# Patient Record
Sex: Male | Born: 1980 | Race: White | Hispanic: No | Marital: Married | Smoking: Never smoker
Health system: Southern US, Community
[De-identification: ages and names within clinical notes are randomized; demographics above are authoritative.]

## PROBLEM LIST (undated history)

## (undated) DIAGNOSIS — G8929 Other chronic pain: Secondary | ICD-10-CM

## (undated) DIAGNOSIS — E785 Hyperlipidemia, unspecified: Secondary | ICD-10-CM

## (undated) DIAGNOSIS — E559 Vitamin D deficiency, unspecified: Secondary | ICD-10-CM

## (undated) DIAGNOSIS — M1990 Unspecified osteoarthritis, unspecified site: Secondary | ICD-10-CM

## (undated) DIAGNOSIS — M25569 Pain in unspecified knee: Secondary | ICD-10-CM

## (undated) HISTORY — DX: Unspecified osteoarthritis, unspecified site: M19.90

## (undated) HISTORY — DX: Vitamin D deficiency, unspecified: E55.9

## (undated) HISTORY — PX: KNEE ARTHROSCOPY: SHX127

## (undated) HISTORY — DX: Hyperlipidemia, unspecified: E78.5

---

## 2013-09-10 DIAGNOSIS — E785 Hyperlipidemia, unspecified: Secondary | ICD-10-CM | POA: Insufficient documentation

## 2013-09-10 DIAGNOSIS — M1990 Unspecified osteoarthritis, unspecified site: Secondary | ICD-10-CM | POA: Insufficient documentation

## 2013-09-17 ENCOUNTER — Encounter: Payer: Self-pay | Admitting: Internal Medicine

## 2013-09-17 ENCOUNTER — Ambulatory Visit (INDEPENDENT_AMBULATORY_CARE_PROVIDER_SITE_OTHER): Payer: BC Managed Care – PPO | Admitting: Internal Medicine

## 2013-09-17 VITALS — BP 126/82 | HR 72 | Temp 98.1°F | Resp 16 | Ht 71.0 in | Wt 222.8 lb

## 2013-09-17 DIAGNOSIS — Z111 Encounter for screening for respiratory tuberculosis: Secondary | ICD-10-CM

## 2013-09-17 DIAGNOSIS — Z79899 Other long term (current) drug therapy: Secondary | ICD-10-CM

## 2013-09-17 DIAGNOSIS — Z0000 Encounter for general adult medical examination without abnormal findings: Secondary | ICD-10-CM

## 2013-09-17 DIAGNOSIS — R740 Nonspecific elevation of levels of transaminase and lactic acid dehydrogenase [LDH]: Secondary | ICD-10-CM

## 2013-09-17 DIAGNOSIS — Z1212 Encounter for screening for malignant neoplasm of rectum: Secondary | ICD-10-CM

## 2013-09-17 DIAGNOSIS — R7402 Elevation of levels of lactic acid dehydrogenase (LDH): Secondary | ICD-10-CM

## 2013-09-17 DIAGNOSIS — E559 Vitamin D deficiency, unspecified: Secondary | ICD-10-CM

## 2013-09-17 DIAGNOSIS — Z113 Encounter for screening for infections with a predominantly sexual mode of transmission: Secondary | ICD-10-CM

## 2013-09-17 LAB — CBC WITH DIFFERENTIAL/PLATELET
BASOS PCT: 0 % (ref 0–1)
Basophils Absolute: 0 10*3/uL (ref 0.0–0.1)
EOS ABS: 0 10*3/uL (ref 0.0–0.7)
EOS PCT: 1 % (ref 0–5)
HCT: 42.3 % (ref 39.0–52.0)
Hemoglobin: 14.8 g/dL (ref 13.0–17.0)
Lymphocytes Relative: 26 % (ref 12–46)
Lymphs Abs: 1.2 10*3/uL (ref 0.7–4.0)
MCH: 30.7 pg (ref 26.0–34.0)
MCHC: 35 g/dL (ref 30.0–36.0)
MCV: 87.8 fL (ref 78.0–100.0)
Monocytes Absolute: 0.5 10*3/uL (ref 0.1–1.0)
Monocytes Relative: 10 % (ref 3–12)
NEUTROS PCT: 63 % (ref 43–77)
Neutro Abs: 2.8 10*3/uL (ref 1.7–7.7)
Platelets: 173 10*3/uL (ref 150–400)
RBC: 4.82 MIL/uL (ref 4.22–5.81)
RDW: 13.1 % (ref 11.5–15.5)
WBC: 4.5 10*3/uL (ref 4.0–10.5)

## 2013-09-17 LAB — HEMOGLOBIN A1C
HEMOGLOBIN A1C: 5.4 % (ref <5.7)
Mean Plasma Glucose: 108 mg/dL (ref <117)

## 2013-09-17 NOTE — Progress Notes (Signed)
Patient ID: Richard Snow,Richard Snow, male   DOB: 1981/03/27, 33 y.o.   MRN: [id number]   Annual Screening Comprehensive Examination   This very nice 33 y.o.Sep WM presents for complete physical.  Patient has no major health issues. He is known to have Hyperlipidemia attempting dietary control. 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 He has had Arthroscopy x1 on the Lt (2001) and on the Rt x2 - 2005; 2009)    Finally, patient has history of Vitamin D Deficiency with last vitamin D 72 in Mar 2014.  Medication Sig  . Cholecalciferol (VITAMIN D PO) Take 10,000 Int'l Units by mouth daily.    No Known Allergies  Past Medical History  Diagnosis Date  . Vitamin D deficiency   . Hyperlipidemia   . DJD (degenerative joint disease)     knees    Past Surgical History  Procedure Laterality Date  . Knee arthroscopy Bilateral 2005 right 2007 left    History   Social History  . Marital Status: Legally Separated    Spouse Name: N/A    Number of Children: N/A  . Years of Education: N/A   Occupational History  . Not on file.   Social History Main Topics  . Smoking status: Never Smoker   . Smokeless tobacco: Not on file  . Alcohol Use: Yes     Comment: ocassional  . Drug Use: No  . Sexual Activity: Not on file                                                                                                                                                                                                         Social History Narrative  . 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    ROS Constitutional: Denies fever, chills, weight loss/gain, headaches, insomnia, fatigue, night sweats, and change in appetite. Eyes: Denies redness, blurred vision, diplopia, discharge, itchy, watery eyes.  ENT: Denies discharge, congestion, post nasal drip, epistaxis, sore throat, earache, hearing loss,  dental pain, Tinnitus, Vertigo, Sinus pain, snoring.  Cardio: Denies chest pain, palpitations, irregular heartbeat, syncope, dyspnea, diaphoresis, orthopnea, PND, claudication, edema Respiratory: denies cough, dyspnea, DOE, pleurisy, hoarseness, laryngitis, wheezing.  Gastrointestinal: Denies dysphagia, heartburn, reflux, water brash, pain, cramps, nausea, vomiting, bloating, diarrhea, constipation, hematemesis, melena, hematochezia, jaundice, hemorrhoids Genitourinary: Denies dysuria, frequency, urgency, nocturia, hesitancy, discharge, hematuria, flank pain Breast: Breast lumps, nipple discharge, bleeding.  Musculoskeletal: Denies arthralgia, myalgia, stiffness, Jt. Swelling,  pain, limp, and strain/sprain. Skin: Denies puritis, rash, hives, warts, acne, eczema, changing in skin lesion Neuro: Weakness, tremor, incoordination, spasms, paresthesia, pain Psychiatric: Denies confusion, memory loss, sensory loss. Endocrine: Denies change in weight, skin, hair change, nocturia, and paresthesia, diabetic polys, visual blurring, hyper /hypo glycemic episodes.  Heme/Lymph: No excessive bleeding, bruising, enlarged lymph nodes.   Physical Exam    BP 126/82  Pulse 72  Temp 98.1 F   Resp 16  Ht 5\' 11"    Wt 222 lb 12.8 oz   BMI 31.09 kg/m2  General Appearance: Well nourished, in no apparent distress. Eyes: PERRLA, EOMs, conjunctiva no swelling or erythema, normal fundi and vessels. Sinuses: No frontal/maxillary tenderness ENT/Mouth: EACs patent / TMs  nl. Nares clear without erythema, swelling, mucoid exudates. Oral hygiene is good. No erythema, swelling, or exudate. Tongue normal, non-obstructing. Tonsils not swollen or erythematous. Hearing normal.  Neck: Supple, thyroid normal. No bruits, nodes or JVD. Respiratory: Respiratory effort normal.  BS equal and clear bilateral without rales, rhonci, wheezing or stridor. Cardio: Heart sounds are normal with regular rate and rhythm and no murmurs, rubs  or gallops. Peripheral pulses are normal and equal bilaterally without edema. No aortic or femoral bruits. Chest: symmetric with normal excursions and percussion. Breasts: Symmetric, without lumps, nipple discharge, retractions, or fibrocystic changes.  Abdomen: Flat, soft, with bowl sounds. Nontender, no guarding, rebound, hernias, masses, or organomegaly.  Lymphatics: Non tender without lymphadenopathy.  Genitourinary: No hernias. Testes nl/unremarkable. Musculoskeletal: Full ROM all peripheral extremities, joint stability, 5/5 strength, and normal gait. Skin: Warm and dry without rashes, lesions, cyanosis, clubbing or  ecchymosis.  Neuro: Cranial nerves intact, reflexes equal bilaterally. Normal muscle tone, no cerebellar symptoms. Sensation intact.  Pysch: Awake and oriented X 3, normal affect, Insight and Judgment appropriate.   Assessment and Plan  1. Annual Screening Examination 2. DJD, Knees 3. Hyperlipidemia 4. Vitamin D Deficiency  Continue prudent diet as discussed, weight control, regular exercise, and medications. Routine screening labs and tests as requested with regular follow-up as recommended.

## 2013-09-17 NOTE — Patient Instructions (Addendum)
Cholesterol Cholesterol is a white, waxy, fat-like protein needed by your body in small amounts. The liver makes all the cholesterol you need. It is carried from the liver by the blood through the blood vessels. Deposits (plaque) may build up on blood vessel walls. This makes the arteries narrower and stiffer. Plaque increases the risk for heart attack and stroke. You cannot feel your cholesterol level even if it is very high. The only way to know is by a blood test to check your lipid (fats) levels. Once you know your cholesterol levels, you should keep a record of the test results. Work with your caregiver to to keep your levels in the desired range. WHAT THE RESULTS MEAN:  Total cholesterol is a rough measure of all the cholesterol in your blood.  LDL is the so-called bad cholesterol. This is the type that deposits cholesterol in the walls of the arteries. You want this level to be low.  HDL is the good cholesterol because it cleans the arteries and carries the LDL away. You want this level to be high.  Triglycerides are fat that the body can either burn for energy or store. High levels are closely linked to heart disease. DESIRED LEVELS:  Total cholesterol below 200.  LDL below 100 for people at risk, below 70 for very high risk.  HDL above 50 is good, above 60 is best.  Triglycerides below 150. HOW TO LOWER YOUR CHOLESTEROL:  Diet.  Choose fish or white meat chicken and [place], roasted or baked. Limit fatty cuts of red meat, fried foods, and processed meats, such as sausage and lunch meat.  Eat lots of fresh fruits and vegetables. Choose whole grains, beans, pasta, potatoes and cereals.  Use only small amounts of olive, corn or canola oils. Avoid butter, mayonnaise, shortening or palm kernel oils. Avoid foods with trans-fats.  Use skim/nonfat milk and low-fat/nonfat yogurt and cheeses. Avoid whole milk, cream, ice cream, egg yolks and cheeses. Healthy desserts include angel food  cake, ginger snaps, animal crackers, hard candy, popsicles, and low-fat/nonfat frozen yogurt. Avoid pastries, cakes, pies and cookies.  Exercise.  A regular program helps decrease LDL and raises HDL.  Helps with weight control.  Do things that increase your activity level like gardening, walking, or taking the stairs.  Medication.  May be prescribed by your caregiver to help lowering cholesterol and the risk for heart disease.  You may need medicine even if your levels are normal if you have several risk factors. HOME CARE INSTRUCTIONS   Follow your diet and exercise programs as suggested by your caregiver.  Take medications as directed.  Have blood work done when your caregiver feels it is necessary. MAKE SURE YOU:   Understand these instructions.  Will watch your condition.  Will get help right away if you are not doing well or get worse.      Vitamin D Deficiency Vitamin D is an important vitamin that your body needs. Having too little of it in your body is called a deficiency. A very bad deficiency can make your bones soft and can cause a condition called rickets.  Vitamin D is important to your body for different reasons, such as:   It helps your body absorb 2 minerals called calcium and phosphorus.  It helps make your bones healthy.  It may prevent some diseases, such as diabetes and multiple sclerosis.  It helps your muscles and heart. You can get vitamin D in several ways. It is a natural part  of some foods. The vitamin is also added to some dairy products and cereals. Some people take vitamin D supplements. Also, your body makes vitamin D when you are in the sun. It changes the sun's rays into a form of the vitamin that your body can use. CAUSES   Not eating enough foods that contain vitamin D.  Not getting enough sunlight.  Having certain digestive system diseases that make it hard to absorb vitamin D. These diseases include Crohn's disease, chronic  pancreatitis, and cystic fibrosis.  Having a surgery in which part of the stomach or small intestine is removed.  Being obese. Fat cells pull vitamin D out of your blood. That means that obese people may not have enough vitamin D left in their blood and in other body tissues.  Having chronic kidney or liver disease. RISK FACTORS Risk factors are things that make you more likely to develop a vitamin D deficiency. They include:  Being older.  Not being able to get outside very much.  Living in a nursing home.  Having had broken bones.  Having weak or thin bones (osteoporosis).  Having a disease or condition that changes how your body absorbs vitamin D.  Having dark skin.  Some medicines such as seizure medicines or steroids.  Being overweight or obese. SYMPTOMS Mild cases of vitamin D deficiency may not have any symptoms. If you have a very bad case, symptoms may include:  Bone pain.  Muscle pain.  Falling often.  Broken bones caused by a minor injury, due to osteoporosis. DIAGNOSIS A blood test is the best way to tell if you have a vitamin D deficiency. TREATMENT Vitamin D deficiency can be treated in different ways. Treatment for vitamin D deficiency depends on what is causing it. Options include:  Taking vitamin D supplements.  Taking a calcium supplement. Your caregiver will suggest what dose is best for you. HOME CARE INSTRUCTIONS  Take any supplements that your caregiver prescribes. Follow the directions carefully. Take only the suggested amount.  Have your blood tested 2 months after you start taking supplements.  Eat foods that contain vitamin D. Healthy choices include:  Fortified dairy products, cereals, or juices. Fortified means vitamin D has been added to the food. Check the label on the package to be sure.  Fatty fish like salmon or trout.  Eggs.  Oysters.  Do not use a tanning bed.  Keep your weight at a healthy level. Lose weight if you  need to.  Keep all follow-up appointments. Your caregiver will need to perform blood tests to make sure your vitamin D deficiency is going away. SEEK MEDICAL CARE IF:  You have any questions about your treatment.  You continue to have symptoms of vitamin D deficiency.  You have nausea or vomiting.  You are constipated.  You feel confused.  You have severe abdominal or back pain. MAKE SURE YOU:  Understand these instructions.  Will watch your condition.  Will get help right away if you are not doing well or get worse.   Wear and Tear Disorders of the Knee (Arthritis, Osteoarthritis) Everyone will experience wear and tear injuries (arthritis, osteoarthritis) of the knee. These are the changes we all get as we age. They come from the joint stress of daily living. The amount of cartilage damage in your knee and your symptoms determine if you need surgery. Mild problems require approximately two months recovery time. More severe problems take several months to recover. With mild problems, your surgeon  may find worn and rough cartilage surfaces. With severe changes, your surgeon may find cartilage that has completely worn away and exposed the bone. Loose bodies of bone and cartilage, bone spurs (excess bone growth), and injuries to the menisci (cushions between the large bones of your leg) are also common. All of these problems can cause pain. For a mild wear and tear problem, rough cartilage may simply need to be shaved and smoothed. For more severe problems with areas of exposed bone, your surgeon may use an instrument for roughing up the bone surfaces to stimulate new cartilage growth. Loose bodies are usually removed. Torn menisci may be trimmed or repaired. ABOUT THE ARTHROSCOPIC PROCEDURE Arthroscopy is a surgical technique. It allows your orthopedic surgeon to diagnose and treat your knee injury with accuracy. The surgeon looks into your knee through a small scope. The scope is like a  small (pencil-sized) telescope. Arthroscopy is less invasive than open knee surgery. You can expect a more rapid recovery. After the procedure, you will be moved to a recovery area until most of the effects of the medication have worn off. Your caregiver will discuss the test results with you. RECOVERY The severity of the arthritis and the type of procedure performed will determine recovery time. Other important factors include age, physical condition, medical conditions, and the type of rehabilitation program. Strengthening your muscles after arthroscopy helps guarantee a better recovery. Follow your caregiver's instructions. Use crutches, rest, elevate, ice, and do knee exercises as instructed. Your caregivers will help you and instruct you with exercises and other physical therapy required to regain your mobility, muscle strength, and functioning following surgery. Only take over-the-counter or prescription medicines for pain, discomfort, or fever as directed by your caregiver.  SEEK MEDICAL CARE IF:  There is increased bleeding (more than a small spot) from the wound. You notice redness, swelling, or increasing pain in the wound. Pus is coming from wound. You develop an unexplained oral temperature above 102 F (38.9 C) , or as your caregiver suggests. You notice a foul smell coming from the wound or dressing. You have severe pain with motion of the knee. SEEK IMMEDIATE MEDICAL CARE IF:  You develop a rash. You have difficulty breathing. You have any allergic problems. MAKE SURE YOU:  Understand these instructions. Will watch your condition. Will get help right away if you are not doing well or get worse. Document Released: 06/07/2000 Document Revised: 09/02/2011 Document Reviewed: 11/04/2007 [facility] Patient Information 2014 [place] [place].

## 2013-09-18 LAB — LIPID PANEL
Cholesterol: 178 mg/dL (ref 0–200)
HDL: 52 mg/dL (ref >39)
LDL Cholesterol: 114 mg/dL — ABNORMAL HIGH (ref 0–99)
Total CHOL/HDL Ratio: 3.4 Ratio
Triglycerides: 59 mg/dL (ref <150)
VLDL: 12 mg/dL (ref 0–40)

## 2013-09-18 LAB — BASIC METABOLIC PANEL WITH GFR
BUN: 17 mg/dL (ref 6–23)
CO2: 20 mEq/L (ref 19–32)
CREATININE: 1.11 mg/dL (ref 0.50–1.35)
Calcium: 9.3 mg/dL (ref 8.4–10.5)
Chloride: 106 mEq/L (ref 96–112)
GFR, Est Non African American: 87 mL/min
Glucose, Bld: 92 mg/dL (ref 70–99)
Potassium: 4.6 mEq/L (ref 3.5–5.3)
SODIUM: 141 meq/L (ref 135–145)

## 2013-09-18 LAB — TSH: TSH: 1.283 u[IU]/mL (ref 0.350–4.500)

## 2013-09-18 LAB — MICROALBUMIN / CREATININE URINE RATIO
Creatinine, Urine: 130.8 mg/dL
Microalb Creat Ratio: 3.8 mg/g (ref 0.0–30.0)
Microalb, Ur: 0.5 mg/dL (ref 0.00–1.89)

## 2013-09-18 LAB — TESTOSTERONE: Testosterone: 453 ng/dL (ref 300–890)

## 2013-09-18 LAB — RPR

## 2013-09-18 LAB — HEPATIC FUNCTION PANEL
ALBUMIN: 4.7 g/dL (ref 3.5–5.2)
ALT: 19 U/L (ref 0–53)
AST: 19 U/L (ref 0–37)
Alkaline Phosphatase: 44 U/L (ref 39–117)
BILIRUBIN DIRECT: 0.1 mg/dL (ref 0.0–0.3)
BILIRUBIN TOTAL: 0.4 mg/dL (ref 0.2–1.2)
Indirect Bilirubin: 0.3 mg/dL (ref 0.2–1.2)
Total Protein: 6.8 g/dL (ref 6.0–8.3)

## 2013-09-18 LAB — MAGNESIUM: Magnesium: 2.1 mg/dL (ref 1.5–2.5)

## 2013-09-18 LAB — URINALYSIS, MICROSCOPIC ONLY
BACTERIA UA: NONE SEEN
CASTS: NONE SEEN
Crystals: NONE SEEN
Squamous Epithelial / LPF: NONE SEEN

## 2013-09-18 LAB — HIV ANTIBODY (ROUTINE TESTING W REFLEX): HIV: NONREACTIVE

## 2013-09-18 LAB — HEPATITIS A ANTIBODY, TOTAL: Hep A Total Ab: REACTIVE — AB

## 2013-09-18 LAB — HEPATITIS C ANTIBODY: HCV AB: NEGATIVE

## 2013-09-18 LAB — VITAMIN D 25 HYDROXY (VIT D DEFICIENCY, FRACTURES): VIT D 25 HYDROXY: 63 ng/mL (ref 30–89)

## 2013-09-18 LAB — HEPATITIS B CORE ANTIBODY, TOTAL: HEP B C TOTAL AB: NONREACTIVE

## 2013-09-18 LAB — INSULIN, FASTING: Insulin fasting, serum: 3 u[IU]/mL (ref 3–28)

## 2013-09-18 LAB — VITAMIN B12: VITAMIN B 12: 481 pg/mL (ref 211–911)

## 2013-09-18 LAB — HEPATITIS B SURFACE ANTIBODY,QUALITATIVE: HEP B S AB: POSITIVE — AB

## 2013-09-20 LAB — HEPATITIS B E ANTIBODY: Hepatitis Be Antibody: NONREACTIVE

## 2013-09-22 LAB — TB SKIN TEST
Induration: 0 mm
TB Skin Test: NEGATIVE

## 2014-09-23 ENCOUNTER — Encounter: Payer: Self-pay | Admitting: Internal Medicine

## 2014-10-07 ENCOUNTER — Encounter: Payer: Self-pay | Admitting: Internal Medicine

## 2014-11-01 ENCOUNTER — Ambulatory Visit (INDEPENDENT_AMBULATORY_CARE_PROVIDER_SITE_OTHER): Payer: BLUE CROSS/BLUE SHIELD | Admitting: Internal Medicine

## 2014-11-01 ENCOUNTER — Encounter: Payer: Self-pay | Admitting: Internal Medicine

## 2014-11-01 VITALS — BP 118/76 | HR 72 | Temp 97.5°F | Resp 16 | Ht 71.0 in | Wt 235.6 lb

## 2014-11-01 DIAGNOSIS — E785 Hyperlipidemia, unspecified: Secondary | ICD-10-CM

## 2014-11-01 DIAGNOSIS — R5383 Other fatigue: Secondary | ICD-10-CM | POA: Diagnosis not present

## 2014-11-01 DIAGNOSIS — Z0001 Encounter for general adult medical examination with abnormal findings: Secondary | ICD-10-CM

## 2014-11-01 DIAGNOSIS — M2390 Unspecified internal derangement of unspecified knee: Secondary | ICD-10-CM

## 2014-11-01 DIAGNOSIS — Z79899 Other long term (current) drug therapy: Secondary | ICD-10-CM | POA: Diagnosis not present

## 2014-11-01 DIAGNOSIS — R6889 Other general symptoms and signs: Secondary | ICD-10-CM

## 2014-11-01 DIAGNOSIS — Z1212 Encounter for screening for malignant neoplasm of rectum: Secondary | ICD-10-CM

## 2014-11-01 DIAGNOSIS — Z111 Encounter for screening for respiratory tuberculosis: Secondary | ICD-10-CM | POA: Diagnosis not present

## 2014-11-01 DIAGNOSIS — E559 Vitamin D deficiency, unspecified: Secondary | ICD-10-CM

## 2014-11-01 DIAGNOSIS — R7309 Other abnormal glucose: Secondary | ICD-10-CM | POA: Insufficient documentation

## 2014-11-01 DIAGNOSIS — R030 Elevated blood-pressure reading, without diagnosis of hypertension: Secondary | ICD-10-CM | POA: Diagnosis not present

## 2014-11-01 DIAGNOSIS — IMO0001 Reserved for inherently not codable concepts without codable children: Secondary | ICD-10-CM | POA: Insufficient documentation

## 2014-11-01 LAB — CBC WITH DIFFERENTIAL/PLATELET
BASOS PCT: 0 % (ref 0–1)
Basophils Absolute: 0 10*3/uL (ref 0.0–0.1)
Eosinophils Absolute: 0.1 10*3/uL (ref 0.0–0.7)
Eosinophils Relative: 1 % (ref 0–5)
HEMATOCRIT: 40.2 % (ref 39.0–52.0)
HEMOGLOBIN: 14.1 g/dL (ref 13.0–17.0)
LYMPHS ABS: 2.2 10*3/uL (ref 0.7–4.0)
Lymphocytes Relative: 38 % (ref 12–46)
MCH: 30.3 pg (ref 26.0–34.0)
MCHC: 35.1 g/dL (ref 30.0–36.0)
MCV: 86.3 fL (ref 78.0–100.0)
MONOS PCT: 7 % (ref 3–12)
MPV: 9.3 fL (ref 8.6–12.4)
Monocytes Absolute: 0.4 10*3/uL (ref 0.1–1.0)
NEUTROS PCT: 54 % (ref 43–77)
Neutro Abs: 3.1 10*3/uL (ref 1.7–7.7)
Platelets: 153 10*3/uL (ref 150–400)
RBC: 4.66 MIL/uL (ref 4.22–5.81)
RDW: 14.1 % (ref 11.5–15.5)
WBC: 5.8 10*3/uL (ref 4.0–10.5)

## 2014-11-01 LAB — HEMOGLOBIN A1C
Hgb A1c MFr Bld: 5.5 % (ref <5.7)
MEAN PLASMA GLUCOSE: 111 mg/dL (ref <117)

## 2014-11-01 NOTE — Progress Notes (Signed)
Patient ID: Richard Finlayharles Rietz Jr., male   DOB: 11-22-1980, 34 y.o.   MRN: [id number]   Annual Comprehensive Examination  This very nice 34 y.o. MWM presents for complete physical.  Patient is screened for labile  HTN, Prediabetes, Hyperlipidemia, and Vitamin D Deficiency. 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 He has had Arthroscopy x1 on the Lt (2001) and on the Rt x2 - 2005; 2009)     Patient denies any cardiac symptoms as chest pain, palpitations, shortness of breath, dizziness or ankle swelling. Today's BP is 118/76 mmHg.   Patient's hyperlipidemia is not controlled with diet. Last lipids were  Not at goal as below.   Lab Results  Component Value Date   CHOL 178 09/17/2013   HDL 52 09/17/2013   LDLCALC 114* 09/17/2013   TRIG 59 09/17/2013   CHOLHDL 3.4 09/17/2013    Patient has morbid Obesity (BMI 32+) and consequently is screened for prediabetes  and patient denies reactive hypoglycemic symptoms, visual blurring, diabetic polys or paresthesias. Last A1c was 5.4% in Mar 2015.     Finally, patient has history of Vitamin D Deficiency of 24 in 2022 and last vitamin D was 63 in Mar 2015.      Medication Sig  . Cholecalciferol (VITAMIN D PO) Take 10,000 Int'l Units by mouth daily.  Marland Kitchen. oxyCODONE-acetaminophen (PERCOCET) 5-325 MG per tablet Take by mouth every 4 (four) hours as needed for severe pain. Patient states [facility]  . meloxicam (MOBIC) 15 MG tablet Take 15 mg by mouth daily.   No Known Allergies   Past Medical History  Diagnosis Date  . Vitamin D deficiency   . Hyperlipidemia   . DJD (degenerative joint disease)     knees   Health Maintenance  Topic Date Due  . INFLUENZA VACCINE  01/23/2015  . TETANUS/TDAP  09/02/2020  . HIV Screening  Completed   Immunization History  Administered Date(s) Administered  . PPD Test 09/17/2013  .  Pneumococcal Polysaccharide-23 08/28/2010  . Tdap 09/03/2010   Past Surgical History  Procedure Laterality Date  . Knee arthroscopy Bilateral 2005 right 2007 left   History   Social History  . Marital Status: Legally Separated    Spouse Name: N/A  . Number of Children: N/A  . Years of Education: N/A   Occupational History  . Not on file.   Social History Main Topics  . Smoking status: Never Smoker   . Smokeless tobacco: Not on file  . Alcohol Use: Yes     Comment: ocassional  . Drug Use: No  . Sexual Activity: Not on file    ROS Constitutional: Denies fever, chills, weight loss/gain, headaches, insomnia,  night sweats or change in appetite. Does c/o fatigue. Eyes: Denies redness, blurred vision, diplopia, discharge, itchy or watery eyes.  ENT: Denies discharge, congestion, post nasal drip, epistaxis, sore throat, earache, hearing loss, dental pain, Tinnitus, Vertigo, Sinus pain or snoring.  Cardio: Denies chest pain, palpitations, irregular heartbeat, syncope, dyspnea, diaphoresis, orthopnea, PND, claudication or edema Respiratory: denies cough, dyspnea, DOE, pleurisy, hoarseness, laryngitis or wheezing.  Gastrointestinal: Denies dysphagia, heartburn, reflux, water brash, pain, cramps, nausea, vomiting, bloating, diarrhea, constipation, hematemesis, melena, hematochezia, jaundice or hemorrhoids Genitourinary: Denies dysuria, frequency, urgency, nocturia, hesitancy, discharge, hematuria or flank pain Musculoskeletal: Denies arthralgia, myalgia, stiffness, Jt. Swelling, pain, limp or  strain/sprain. Denies Falls. Skin: Denies puritis, rash, hives, warts, acne, eczema or change in skin lesion Neuro: No weakness, tremor, incoordination, spasms, paresthesia or pain Psychiatric: Denies confusion, memory loss or sensory loss. Denies Depression. Endocrine: Denies change in weight, skin, hair change, nocturia, and paresthesia, diabetic polys, visual blurring or hyper / hypo glycemic  episodes.  Heme/Lymph: No excessive bleeding, bruising or enlarged lymph nodes.  Physical Exam  BP 118/76   Pulse 72  Temp 97.5 F   Resp 16  Ht 5\' 11"    Wt 235 lb 9.6 oz    BMI 32.87   General Appearance: Well nourished, in no apparent distress. Eyes: PERRLA, EOMs, conjunctiva no swelling or erythema, normal fundi and vessels. Sinuses: No frontal/maxillary tenderness ENT/Mouth: EACs patent / TMs  nl. Nares clear without erythema, swelling, mucoid exudates. Oral hygiene is good. No erythema, swelling, or exudate. Tongue normal, non-obstructing. Tonsils not swollen or erythematous. Hearing normal.  Neck: Supple, thyroid normal. No bruits, nodes or JVD. Respiratory: Respiratory effort normal.  BS equal and clear bilateral without rales, rhonci, wheezing or stridor. Cardio: Heart sounds are normal with regular rate and rhythm and no murmurs, rubs or gallops. Peripheral pulses are normal and equal bilaterally without edema. No aortic or femoral bruits. Chest: symmetric with normal excursions and percussion.  Abdomen: Flat, soft, with bowel sounds. Nontender, no guarding, rebound, hernias, masses, or organomegaly.  Lymphatics: Non tender without lymphadenopathy.  Genitourinary: No hernias.Testes nl. Musculoskeletal: Full ROM all peripheral extremities, joint stability, 5/5 strength, and normal gait. Sl crepitus of the knees w/o synovitis or effusion.  Skin: Warm and dry without rashes, lesions, cyanosis, clubbing or  ecchymosis.  Neuro: Cranial nerves intact, reflexes equal bilaterally. Normal muscle tone, no cerebellar symptoms. Sensation intact.  Pysch: Awake and oriented X 3 with normal affect, insight and judgment appropriate.   Assessment and Plan  1. Elevated BP screening   2. Hyperlipidemia  - Lipid panel  3. Abnormal glucose screening  - Hemoglobin A1c - Insulin, random  4. Vitamin D deficiency  - Vit D  25 hydroxy   5. Screening for rectal cancer   6. Other  fatigue  - Vitamin B12 - Iron and TIBC - TSH  7. Medication management  - Urine Microscopic - CBC with Differential/Platelet - BASIC METABOLIC PANEL WITH GFR - Hepatic function panel - Magnesium   Continue prudent diet as discussed, weight control, BP monitoring, regular exercise, and medications as discussed.  Discussed med effects and SE's. Routine screening labs and tests as requested with regular follow-up as recommended. Over 40 minutes of exam, counseling &  chart review was performed

## 2014-11-01 NOTE — Patient Instructions (Signed)
Recommend Low dose or baby Aspirin 81 mg daily   To reduce risk of Colon Cancer 20 %, Skin Cancer 26 % , Melanoma 46% and   Pancreatic cancer 60%  ++++++++++++++++++ Vitamin D goal is between 70-100.   Please make sure that you are taking your Vitamin D as directed.   It is very important as a natural antiinflammatory   helping hair, skin, and nails, as well as reducing stroke and heart attack risk.   It helps your bones and helps with mood.  It also decreases numerous cancer risks so please take it as directed.   Low Vit D is associated with a 200-300% higher risk for CANCER   and 200-300% higher risk for HEART   ATTACK  &  STROKE.    .....................................Marland Kitchen  It is also associated with higher death rate at younger ages,   autoimmune diseases like Rheumatoid arthritis, Lupus, Multiple Sclerosis.     Also many other serious conditions, like depression, Alzheimer's  Dementia, infertility, muscle aches, fatigue, fibromyalgia - just to name a few.  +++++++++++++++++++  Preventive Care for Adults  A healthy lifestyle and preventive care can promote health and wellness. Preventive health guidelines for men include the following key practices:  A routine yearly physical is a good way to check with your health care provider about your health and preventative screening. It is a chance to share any concerns and updates on your health and to receive a thorough exam.  Visit your dentist for a routine exam and preventative care every 6 months. Brush your teeth twice a day and floss once a day. Good oral hygiene prevents tooth decay and gum disease.  The frequency of eye exams is based on your age, health, family medical history, use of contact lenses, and other factors. Follow your health care provider's recommendations for frequency of eye exams.  Eat a healthy diet. Foods such as vegetables, fruits, whole grains, low-fat dairy products, and lean protein foods contain  the nutrients you need without too many calories. Decrease your intake of foods high in solid fats, added sugars, and salt. Eat the right amount of calories for you.Get information about a proper diet from your health care provider, if necessary.  Regular physical exercise is one of the most important things you can do for your health. Most adults should get at least 150 minutes of moderate-intensity exercise (any activity that increases your heart rate and causes you to sweat) each week. In addition, most adults need muscle-strengthening exercises on 2 or more days a week.  Maintain a healthy weight. The body mass index (BMI) is a screening tool to identify possible weight problems. It provides an estimate of body fat based on height and weight. Your health care provider can find your BMI and can help you achieve or maintain a healthy weight.For adults 20 years and older:  A BMI below 18.5 is considered underweight.  A BMI of 18.5 to 24.9 is normal.  A BMI of 25 to 29.9 is considered overweight.  A BMI of 30 and above is considered obese.  Maintain normal blood lipids and cholesterol levels by exercising and minimizing your intake of saturated fat. Eat a balanced diet with plenty of fruit and vegetables. Blood tests for lipids and cholesterol should begin at age 68 and be repeated every 5 years. If your lipid or cholesterol levels are high, you are over 50, or you are at high risk for heart disease, you may need your cholesterol levels  checked more frequently.Ongoing high lipid and cholesterol levels should be treated with medicines if diet and exercise are not working.  If you smoke, find out from your health care provider how to quit. If you do not use tobacco, do not start.  Lung cancer screening is recommended for adults aged 68-80 years who are at high risk for developing lung cancer because of a history of smoking. A yearly low-dose CT scan of the lungs is recommended for people who have  at least a 30-pack-year history of smoking and are a current smoker or have quit within the past 15 years. A pack year of smoking is smoking an average of 1 pack of cigarettes a day for 1 year (for example: 1 pack a day for 30 years or 2 packs a day for 15 years). Yearly screening should continue until the smoker has stopped smoking for at least 15 years. Yearly screening should be stopped for people who develop a health problem that would prevent them from having lung cancer treatment.  If you choose to drink alcohol, do not have more than 2 drinks per day. One drink is considered to be 12 ounces (355 mL) of beer, 5 ounces (148 mL) of wine, or 1.5 ounces (44 mL) of liquor.  High blood pressure causes heart disease and increases the risk of stroke. Your blood pressure should be checked. Ongoing high blood pressure should be treated with medicines, if weight loss and exercise are not effective.  If you are 49-19 years old, ask your health care provider if you should take aspirin to prevent heart disease.  Diabetes screening involves taking a blood sample to check your fasting blood sugar level. Testing should be considered at a younger age or be carried out more frequently if you are overweight and have at least 1 risk factor for diabetes.  Colorectal cancer can be detected and often prevented. Most routine colorectal cancer screening begins at the age of 37 and continues through age 3. However, your health care provider may recommend screening at an earlier age if you have risk factors for colon cancer. On a yearly basis, your health care provider may provide home test kits to check for hidden blood in the stool. Use of a small camera at the end of a tube to directly examine the colon (sigmoidoscopy or colonoscopy) can detect the earliest forms of colorectal cancer. Talk to your health care provider about this at age 84, when routine screening begins. Direct exam of the colon should be repeated every 5-10  years through age 70, unless early forms of precancerous polyps or small growths are found.  Screening for abdominal aortic aneurysm (AAA)  are recommended for persons over age 25 who have history of hypertensionor who are current or former smokers.  Talk with your health care provider about prostate cancer screening.  Testicular cancer screening is recommended for adult males. Screening includes self-exam, a health care provider exam, and other screening tests. Consult with your health care provider about any symptoms you have or any concerns you have about testicular cancer.  Use sunscreen. Apply sunscreen liberally and repeatedly throughout the day. You should seek shade when your shadow is shorter than you. Protect yourself by wearing long sleeves, pants, a wide-brimmed hat, and sunglasses year round, whenever you are outdoors.  Once a month, do a whole-body skin exam, using a mirror to look at the skin on your back. Tell your health care provider about new moles, moles that have irregular borders,  moles that are larger than a pencil eraser, or moles that have changed in shape or color.  Stay current with required vaccines (immunizations).  Influenza vaccine. All adults should be immunized every year.  Tetanus, diphtheria, and acellular pertussis (Td, Tdap) vaccine. An adult who has not previously received Tdap or who does not know his vaccine status should receive 1 dose of Tdap. This initial dose should be followed by tetanus and diphtheria toxoids (Td) booster doses every 10 years. Adults with an unknown or incomplete history of completing a 3-dose immunization series with Td-containing vaccines should begin or complete a primary immunization series including a Tdap dose. Adults should receive a Td booster every 10 years.  Zoster vaccine. One dose is recommended for adults aged 42 years or older unless certain conditions are present.    Pneumococcal 13-valent conjugate (PCV13) vaccine.  When indicated, a person who is uncertain of his immunization history and has no record of immunization should receive the PCV13 vaccine. An adult aged 69 years or older who has certain medical conditions and has not been previously immunized should receive 1 dose of PCV13 vaccine. This PCV13 should be followed with a dose of pneumococcal polysaccharide (PPSV23) vaccine. The PPSV23 vaccine dose should be obtained at least 8 weeks after the dose of PCV13 vaccine. An adult aged 27 years or older who has certain medical conditions and previously received 1 or more doses of PPSV23 vaccine should receive 1 dose of PCV13. The PCV13 vaccine dose should be obtained 1 or more years after the last PPSV23 vaccine dose.    Pneumococcal polysaccharide (PPSV23) vaccine. When PCV13 is also indicated, PCV13 should be obtained first. All adults aged 47 years and older should be immunized. An adult younger than age 62 years who has certain medical conditions should be immunized. Any person who resides in a nursing home or long-term care facility should be immunized. An adult smoker should be immunized. People with an immunocompromised condition and certain other conditions should receive both PCV13 and PPSV23 vaccines. People with human immunodeficiency virus (HIV) infection should be immunized as soon as possible after diagnosis. Immunization during chemotherapy or radiation therapy should be avoided. Routine use of PPSV23 vaccine is not recommended for American Indians, [place] Natives, or people younger than 65 years unless there are medical conditions that require PPSV23 vaccine. When indicated, people who have unknown immunization and have no record of immunization should receive PPSV23 vaccine. One-time revaccination 5 years after the first dose of PPSV23 is recommended for people aged 19-64 years who have chronic kidney failure, nephrotic syndrome, asplenia, or immunocompromised conditions. People who received 1-2 doses of  PPSV23 before age 34 years should receive another dose of PPSV23 vaccine at age 18 years or later if at least 5 years have passed since the previous dose. Doses of PPSV23 are not needed for people immunized with PPSV23 at or after age 29 years.  Hepatitis A vaccine. Adults who wish to be protected from this disease, have certain high-risk conditions, work with hepatitis A-infected animals, work in hepatitis A research labs, or travel to or work in countries with a high rate of hepatitis A should be immunized. Adults who were previously unvaccinated and who anticipate close contact with an international adoptee during the first 60 days after arrival in the [place] from a country with a high rate of hepatitis A should be immunized.  Hepatitis B vaccine. Adults should be immunized if they wish to be protected from this disease, have  certain high-risk conditions, may be exposed to blood or other infectious body fluids, are household contacts or sex partners of hepatitis B positive people, are clients or workers in certain care facilities, or travel to or work in countries with a high rate of hepatitis B.  Preventive Service / Frequency  Ages 43 to 32  Blood pressure check.  Lipid and cholesterol check.  Hepatitis C blood test.** / For any individual with known risks for hepatitis C.  Skin self-exam. / Monthly.  Influenza vaccine. / Every year.  Tetanus, diphtheria, and acellular pertussis (Tdap, Td) vaccine.** / Consult your health care provider. 1 dose of Td every 10 years.  HPV vaccine. / 3 doses over 6 months, if 72 or younger.  Measles, mumps, rubella (MMR) vaccine.** / You need at least 1 dose of MMR if you were born in 1957 or later. You may also need a second dose.  Pneumococcal 13-valent conjugate (PCV13) vaccine.** / Consult your health care provider.  Pneumococcal polysaccharide (PPSV23) vaccine.** / 1 to 2 doses if you smoke cigarettes or if you have certain  conditions.  Meningococcal vaccine.** / 1 dose if you are age 66 to 30 years and a Market researcher living in a residence hall, or have one of several medical conditions. You may also need additional booster doses.  Hepatitis A vaccine.** / Consult your health care provider.  Hepatitis B vaccine.** / Consult your health care provider.

## 2014-11-02 LAB — LIPID PANEL
Cholesterol: 215 mg/dL — ABNORMAL HIGH (ref 0–200)
HDL: 38 mg/dL — AB (ref >=40)
LDL Cholesterol: 125 mg/dL — ABNORMAL HIGH (ref 0–99)
Total CHOL/HDL Ratio: 5.7 Ratio
Triglycerides: 258 mg/dL — ABNORMAL HIGH (ref <150)
VLDL: 52 mg/dL — AB (ref 0–40)

## 2014-11-02 LAB — HEPATIC FUNCTION PANEL
ALBUMIN: 4.4 g/dL (ref 3.5–5.2)
ALK PHOS: 46 U/L (ref 39–117)
ALT: 18 U/L (ref 0–53)
AST: 18 U/L (ref 0–37)
BILIRUBIN INDIRECT: 0.2 mg/dL (ref 0.2–1.2)
Bilirubin, Direct: 0.1 mg/dL (ref 0.0–0.3)
TOTAL PROTEIN: 7.1 g/dL (ref 6.0–8.3)
Total Bilirubin: 0.3 mg/dL (ref 0.2–1.2)

## 2014-11-02 LAB — MAGNESIUM: Magnesium: 2 mg/dL (ref 1.5–2.5)

## 2014-11-02 LAB — BASIC METABOLIC PANEL WITH GFR
BUN: 17 mg/dL (ref 6–23)
CHLORIDE: 100 meq/L (ref 96–112)
CO2: 29 meq/L (ref 19–32)
Calcium: 9.6 mg/dL (ref 8.4–10.5)
Creat: 1.05 mg/dL (ref 0.50–1.35)
GFR, Est African American: >89 mL/min
GFR, Est Non African American: >89 mL/min
Glucose, Bld: 92 mg/dL (ref 70–99)
Potassium: 4.8 mEq/L (ref 3.5–5.3)
Sodium: 138 mEq/L (ref 135–145)

## 2014-11-02 LAB — URINALYSIS, MICROSCOPIC ONLY
BACTERIA UA: NONE SEEN
Casts: NONE SEEN
Crystals: NONE SEEN
SQUAMOUS EPITHELIAL / LPF: NONE SEEN

## 2014-11-02 LAB — IRON AND TIBC
%SAT: 37 % (ref 20–55)
Iron: 121 ug/dL (ref 42–165)
TIBC: 323 ug/dL (ref 215–435)
UIBC: 202 ug/dL (ref 125–400)

## 2014-11-02 LAB — INSULIN, RANDOM: INSULIN: 5.8 u[IU]/mL (ref 2.0–19.6)

## 2014-11-02 LAB — TSH: TSH: 2.632 u[IU]/mL (ref 0.350–4.500)

## 2014-11-02 LAB — VITAMIN D 25 HYDROXY (VIT D DEFICIENCY, FRACTURES): Vit D, 25-Hydroxy: 37 ng/mL (ref 30–100)

## 2014-11-02 LAB — VITAMIN B12: VITAMIN B 12: 639 pg/mL (ref 211–911)

## 2014-11-06 ENCOUNTER — Encounter: Payer: Self-pay | Admitting: Internal Medicine

## 2014-12-16 ENCOUNTER — Encounter (HOSPITAL_COMMUNITY): Payer: Self-pay

## 2014-12-16 ENCOUNTER — Emergency Department (HOSPITAL_COMMUNITY)
Admission: EM | Admit: 2014-12-16 | Discharge: 2014-12-16 | Disposition: A | Discharge status: Home / Self Care | Payer: BLUE CROSS/BLUE SHIELD | Attending: Emergency Medicine | Admitting: Emergency Medicine

## 2014-12-16 ENCOUNTER — Emergency Department (HOSPITAL_COMMUNITY): Payer: BLUE CROSS/BLUE SHIELD

## 2014-12-16 DIAGNOSIS — Z79899 Other long term (current) drug therapy: Secondary | ICD-10-CM | POA: Insufficient documentation

## 2014-12-16 DIAGNOSIS — M791 Myalgia: Secondary | ICD-10-CM | POA: Diagnosis not present

## 2014-12-16 DIAGNOSIS — E559 Vitamin D deficiency, unspecified: Secondary | ICD-10-CM | POA: Insufficient documentation

## 2014-12-16 DIAGNOSIS — R531 Weakness: Secondary | ICD-10-CM | POA: Diagnosis not present

## 2014-12-16 DIAGNOSIS — Z7982 Long term (current) use of aspirin: Secondary | ICD-10-CM | POA: Diagnosis not present

## 2014-12-16 DIAGNOSIS — R52 Pain, unspecified: Secondary | ICD-10-CM | POA: Diagnosis present

## 2014-12-16 DIAGNOSIS — R5383 Other fatigue: Secondary | ICD-10-CM | POA: Diagnosis not present

## 2014-12-16 HISTORY — DX: Other chronic pain: G89.29

## 2014-12-16 HISTORY — DX: Pain in unspecified knee: M25.569

## 2014-12-16 LAB — COMPREHENSIVE METABOLIC PANEL
ALBUMIN: 4 g/dL (ref 3.5–5.0)
ALK PHOS: 45 U/L (ref 38–126)
ALT: 27 U/L (ref 17–63)
AST: 75 U/L — ABNORMAL HIGH (ref 15–41)
Anion gap: 9 (ref 5–15)
BUN: 11 mg/dL (ref 6–20)
CO2: 25 mmol/L (ref 22–32)
Calcium: 8.7 mg/dL — ABNORMAL LOW (ref 8.9–10.3)
Chloride: 103 mmol/L (ref 101–111)
Creatinine, Ser: 1.01 mg/dL (ref 0.61–1.24)
GFR calc Af Amer: >60 mL/min (ref >60)
GFR calc non Af Amer: >60 mL/min (ref >60)
Glucose, Bld: 103 mg/dL — ABNORMAL HIGH (ref 65–99)
POTASSIUM: 3.8 mmol/L (ref 3.5–5.1)
Sodium: 137 mmol/L (ref 135–145)
Total Bilirubin: 0.6 mg/dL (ref 0.3–1.2)
Total Protein: 7 g/dL (ref 6.5–8.1)

## 2014-12-16 LAB — URINALYSIS, ROUTINE W REFLEX MICROSCOPIC
BILIRUBIN URINE: NEGATIVE
Glucose, UA: NEGATIVE mg/dL
HGB URINE DIPSTICK: NEGATIVE
Ketones, ur: 15 mg/dL — AB
Leukocytes, UA: NEGATIVE
Nitrite: NEGATIVE
PH: 7 (ref 5.0–8.0)
Protein, ur: NEGATIVE mg/dL
SPECIFIC GRAVITY, URINE: 1.018 (ref 1.005–1.030)
Urobilinogen, UA: 0.2 mg/dL (ref 0.0–1.0)

## 2014-12-16 LAB — CBC WITH DIFFERENTIAL/PLATELET
BASOS ABS: 0 10*3/uL (ref 0.0–0.1)
Basophils Relative: 0 % (ref 0–1)
Eosinophils Absolute: 0.1 10*3/uL (ref 0.0–0.7)
Eosinophils Relative: 2 % (ref 0–5)
HCT: 39.3 % (ref 39.0–52.0)
Hemoglobin: 14 g/dL (ref 13.0–17.0)
LYMPHS PCT: 24 % (ref 12–46)
Lymphs Abs: 1 10*3/uL (ref 0.7–4.0)
MCH: 30.9 pg (ref 26.0–34.0)
MCHC: 35.6 g/dL (ref 30.0–36.0)
MCV: 86.8 fL (ref 78.0–100.0)
Monocytes Absolute: 0.4 10*3/uL (ref 0.1–1.0)
Monocytes Relative: 11 % (ref 3–12)
NEUTROS ABS: 2.6 10*3/uL (ref 1.7–7.7)
Neutrophils Relative %: 63 % (ref 43–77)
PLATELETS: 128 10*3/uL — AB (ref 150–400)
RBC: 4.53 MIL/uL (ref 4.22–5.81)
RDW: 12.7 % (ref 11.5–15.5)
WBC: 4.1 10*3/uL (ref 4.0–10.5)

## 2014-12-16 LAB — CK
CK TOTAL: 1982 U/L — AB (ref 49–397)
Total CK: 2413 U/L — ABNORMAL HIGH (ref 49–397)

## 2014-12-16 LAB — I-STAT CG4 LACTIC ACID, ED
LACTIC ACID, VENOUS: 1.2 mmol/L (ref 0.5–2.0)
Lactic Acid, Venous: 0.89 mmol/L (ref 0.5–2.0)

## 2014-12-16 LAB — LACTIC ACID, PLASMA
LACTIC ACID, VENOUS: 0.9 mmol/L (ref 0.5–2.0)
Lactic Acid, Venous: 1.4 mmol/L (ref 0.5–2.0)

## 2014-12-16 MED ORDER — OXYCODONE-ACETAMINOPHEN 5-325 MG PO TABS: 2.0000 | ORAL_TABLET | ORAL | Status: AC | PRN

## 2014-12-16 MED ORDER — IOHEXOL 300 MG/ML  SOLN
100.0000 mL | Freq: Once | INTRAMUSCULAR | Status: AC | PRN
  Administered 2014-12-16: 100 mL via INTRAVENOUS

## 2014-12-16 MED ORDER — SODIUM CHLORIDE 0.9 % IV BOLUS (SEPSIS)
1000.0000 mL | Freq: Once | INTRAVENOUS | Status: AC
  Administered 2014-12-16: 1000 mL via INTRAVENOUS

## 2014-12-16 MED ORDER — KETOROLAC TROMETHAMINE 30 MG/ML IJ SOLN
30.0000 mg | Freq: Once | INTRAMUSCULAR | Status: AC
  Administered 2014-12-16: 30 mg via INTRAVENOUS
  Filled 2014-12-16: qty 1

## 2014-12-16 MED ORDER — ACETAMINOPHEN 325 MG PO TABS
650.0000 mg | ORAL_TABLET | Freq: Once | ORAL | Status: AC
  Administered 2014-12-16: 650 mg via ORAL
  Filled 2014-12-16: qty 2

## 2014-12-16 MED ORDER — IOHEXOL 300 MG/ML  SOLN
50.0000 mL | Freq: Once | INTRAMUSCULAR | Status: AC | PRN
  Administered 2014-12-16: 50 mL via ORAL

## 2014-12-16 MED ORDER — IBUPROFEN 800 MG PO TABS: 800.0000 mg | ORAL_TABLET | Freq: Three times a day (TID) | ORAL | Status: AC

## 2014-12-16 NOTE — Discharge Instructions (Signed)
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
## 2014-12-16 NOTE — ED Provider Notes (Signed)
Ct scan of abdomen is normal.   Pt reevaluated.  Pt sitting eating [employer].  Vitals normal. I counseled pt on results. Pt advised to see his MD on Monday.   Pt given rx for percocet and ibuprofen.   I suspect myalgias second to viral etiology  Elson Areas, PA-C 12/16/14 1856  Donnetta Hutching, MD 12/16/14 [phone]

## 2014-12-16 NOTE — ED Notes (Signed)
Richard Snow in lab called for blood draw after failed attempts x 3 persons.

## 2014-12-16 NOTE — ED Notes (Signed)
Nurse drawing labs. 

## 2014-12-16 NOTE — ED Notes (Signed)
Attempted Lab draw - unsuccessful - RN Alcario Drought aware

## 2014-12-16 NOTE — ED Notes (Signed)
Patient reminded that a urine specimen was needed. 
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
## 2015-02-06 ENCOUNTER — Ambulatory Visit: Payer: Self-pay | Admitting: Physician Assistant

## 2015-03-14 ENCOUNTER — Ambulatory Visit: Payer: Self-pay | Admitting: Physician Assistant

## 2015-10-09 ENCOUNTER — Encounter: Payer: Self-pay | Admitting: Internal Medicine

## 2015-11-22 ENCOUNTER — Encounter: Payer: Self-pay | Admitting: Internal Medicine

## 2015-12-29 ENCOUNTER — Encounter: Payer: Self-pay | Admitting: Internal Medicine
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
## 2017-01-08 ENCOUNTER — Encounter: Payer: Self-pay | Admitting: Internal Medicine

## 2017-01-28 ENCOUNTER — Encounter: Payer: Self-pay | Admitting: Internal Medicine

## 2018-06-23 ENCOUNTER — Emergency Department (HOSPITAL_COMMUNITY)
Admission: EM | Admit: 2018-06-23 | Discharge: 2018-06-23 | Disposition: A | Discharge status: Home / Self Care | Payer: 59 | Attending: Emergency Medicine | Admitting: Emergency Medicine

## 2018-06-23 ENCOUNTER — Encounter (HOSPITAL_COMMUNITY): Payer: Self-pay | Admitting: Obstetrics and Gynecology

## 2018-06-23 ENCOUNTER — Emergency Department (HOSPITAL_COMMUNITY): Payer: 59

## 2018-06-23 DIAGNOSIS — Y999 Unspecified external cause status: Secondary | ICD-10-CM | POA: Insufficient documentation

## 2018-06-23 DIAGNOSIS — S43101A Unspecified dislocation of right acromioclavicular joint, initial encounter: Secondary | ICD-10-CM

## 2018-06-23 DIAGNOSIS — R079 Chest pain, unspecified: Secondary | ICD-10-CM | POA: Diagnosis not present

## 2018-06-23 DIAGNOSIS — S4991XA Unspecified injury of right shoulder and upper arm, initial encounter: Secondary | ICD-10-CM | POA: Diagnosis present

## 2018-06-23 DIAGNOSIS — Z79899 Other long term (current) drug therapy: Secondary | ICD-10-CM | POA: Diagnosis not present

## 2018-06-23 DIAGNOSIS — Y929 Unspecified place or not applicable: Secondary | ICD-10-CM | POA: Insufficient documentation

## 2018-06-23 DIAGNOSIS — S2231XA Fracture of one rib, right side, initial encounter for closed fracture: Secondary | ICD-10-CM

## 2018-06-23 DIAGNOSIS — Y939 Activity, unspecified: Secondary | ICD-10-CM | POA: Diagnosis not present

## 2018-06-23 DIAGNOSIS — S43111A Subluxation of right acromioclavicular joint, initial encounter: Secondary | ICD-10-CM | POA: Diagnosis not present

## 2018-06-23 DIAGNOSIS — S0990XA Unspecified injury of head, initial encounter: Secondary | ICD-10-CM | POA: Insufficient documentation

## 2018-06-23 LAB — I-STAT CHEM 8, ED
BUN: 16 mg/dL (ref 6–20)
Calcium, Ion: 1.04 mmol/L — ABNORMAL LOW (ref 1.15–1.40)
Chloride: 108 mmol/L (ref 98–111)
Creatinine, Ser: 1 mg/dL (ref 0.61–1.24)
Glucose, Bld: 81 mg/dL (ref 70–99)
HCT: 41 % (ref 39.0–52.0)
Hemoglobin: 13.9 g/dL (ref 13.0–17.0)
Potassium: 3.5 mmol/L (ref 3.5–5.1)
Sodium: 141 mmol/L (ref 135–145)
TCO2: 21 mmol/L — ABNORMAL LOW (ref 22–32)

## 2018-06-23 MED ORDER — SODIUM CHLORIDE (PF) 0.9 % IJ SOLN
INTRAMUSCULAR | Status: AC
  Filled 2018-06-23: qty 50

## 2018-06-23 MED ORDER — OXYCODONE-ACETAMINOPHEN 5-325 MG PO TABS: 1.0000 | ORAL_TABLET | Freq: Four times a day (QID) | ORAL | 0 refills | Status: AC | PRN

## 2018-06-23 MED ORDER — IOPAMIDOL (ISOVUE-300) INJECTION 61%
75.0000 mL | Freq: Once | INTRAVENOUS | Status: AC | PRN
  Administered 2018-06-23: 75 mL via INTRAVENOUS

## 2018-06-23 MED ORDER — OXYCODONE-ACETAMINOPHEN 5-325 MG PO TABS
1.0000 | ORAL_TABLET | Freq: Once | ORAL | Status: AC
  Administered 2018-06-23: 1 via ORAL
  Filled 2018-06-23: qty 1

## 2018-06-23 MED ORDER — NAPROXEN 500 MG PO TABS: 500.0000 mg | ORAL_TABLET | Freq: Two times a day (BID) | ORAL | 0 refills | Status: AC | PRN
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
## 2018-06-23 NOTE — ED Notes (Signed)
Patient reported he already had an incentive spirometer at home from after his prior knee surgery. RN instructed him to use it at home and he reported he would.

## 2018-06-23 NOTE — ED Notes (Signed)
Pt reports he is having some pain in his back and around to the front shoulder at this time

## 2018-06-23 NOTE — Discharge Instructions (Addendum)
For your shoulder: your xray shows that you [facility]. Wear shoulder sling until you see the orthopedist. Ice your shoulder throughout the day, using an ice pack for 20 minutes at a time every hour. Alternate between naprosyn and percocet as directed as needed for pain relief. Do not drive or operate machinery with pain medication use. Call orthopedic follow up today or tomorrow to schedule followup appointment for recheck in 1 week. Return to the ER for changes or worsening symptoms.  For your rib fracture: Your xray shows a rib fracture. It's very important that you use the incentive spirometer hourly while awake, in order to keep your lungs fully inflated. Also perform a good hard cough every hour after the incentive spirometer use. You can consider using a LOOSELY WRAPPED ace wrap around the area, but do NOT put it on too tight where it would cause you to not be able to take a full deep breath. Use pain medications listed above to help with pain, but don't drive or operate machinery with narcotic pain medication use. You may consider using ice and/or heat to the areas of pain, no more than 20 minutes every hour for either one. Your CT scan showed prominent soft tissue in the upper chest, which just needs to be continued to be monitored by your regular doctor. Follow up with your regular doctor in 1 week for recheck of symptoms. Return to the ER for changes or worsening symptoms.
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
## 2018-07-01 ENCOUNTER — Ambulatory Visit (INDEPENDENT_AMBULATORY_CARE_PROVIDER_SITE_OTHER): Payer: 59 | Admitting: Physician Assistant

## 2018-07-01 ENCOUNTER — Encounter (INDEPENDENT_AMBULATORY_CARE_PROVIDER_SITE_OTHER): Payer: Self-pay | Admitting: Physician Assistant

## 2018-07-01 DIAGNOSIS — S43121A Dislocation of right acromioclavicular joint, 100%-200% displacement, initial encounter: Secondary | ICD-10-CM

## 2018-07-01 DIAGNOSIS — S43101A Unspecified dislocation of right acromioclavicular joint, initial encounter: Secondary | ICD-10-CM
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
## 2018-08-12 ENCOUNTER — Ambulatory Visit (INDEPENDENT_AMBULATORY_CARE_PROVIDER_SITE_OTHER): Payer: 59 | Admitting: Orthopedic Surgery

## 2018-08-12 ENCOUNTER — Ambulatory Visit (INDEPENDENT_AMBULATORY_CARE_PROVIDER_SITE_OTHER): Payer: Self-pay

## 2018-08-12 ENCOUNTER — Encounter (INDEPENDENT_AMBULATORY_CARE_PROVIDER_SITE_OTHER): Payer: Self-pay | Admitting: Orthopedic Surgery

## 2018-08-12 DIAGNOSIS — S43101A Unspecified dislocation of right acromioclavicular joint, initial encounter: Secondary | ICD-10-CM

## 2018-08-13 ENCOUNTER — Encounter (INDEPENDENT_AMBULATORY_CARE_PROVIDER_SITE_OTHER): Payer: Self-pay | Admitting: Orthopedic Surgery

## 2018-08-13 NOTE — Progress Notes (Signed)
Office Visit Note   Patient: Richard Snow.           Date of Birth: 08/31/1980           MRN: [id number] Visit Date: 08/12/2018 Requested by: Lucky Cowboy, MD [address] [place], [place] [address] PCP: Lucky Cowboy, MD  Subjective: Chief Complaint  Patient presents with  . Right Shoulder - Follow-up    HPI: Richard Snow is a patient here for 6-week follow-up of the right shoulder AC joint separation.  Date of injury 06/23/2018.  States he is feeling very good.  He is ready to get back to working out.  Does not report any pain.  He works in [employer].              ROS: All systems reviewed are negative as they relate to the chief complaint within the history of present illness.  Patient denies  fevers or chills.   Assessment & Plan: Visit Diagnoses:  1. Separation of right acromioclavicular joint, initial encounter     Plan: Impression is right shoulder grade 3 AC separation which is currently asymptomatic.  Has pretty good stability of the [facility] joint and distal clavicle on exam.  I think we can let him resume activity as tolerated on a gradual basis.  I will see him back as needed.  No indication for surgical stabilization of that distal clavicle at this time  Follow-Up Instructions: Return if symptoms worsen or fail to improve.   Orders:  Orders Placed This Encounter  Procedures  . XR AC Joints   No orders of the defined types were placed in this encounter.     Procedures: No procedures performed   Clinical Data: No additional findings.  Objective: Vital Signs: There were no vitals taken for this visit.  Physical Exam:   Constitutional: Patient appears well-developed HEENT:  Head: Normocephalic Eyes:EOM are normal Neck: Normal range of motion Cardiovascular: Normal rate Pulmonary/chest: Effort normal Neurologic: Patient is alert Skin: Skin is warm Psychiatric: Patient has normal mood and affect    Ortho Exam: Ortho  exam demonstrates full active and passive range of motion of the right shoulder with negative apprehension relocation testing.  Does have prominence of the clavicle on the right compared to the left but no real anterior posterior instability.  Motor sensory function of the hand is intact.  Neck range of motion is full.  No scapular dyskinesia with forward flexion.  Specialty Comments:  No specialty comments available.  Imaging: Xr Ac Joints  Result Date: 08/13/2018 Unweighted AP [facility] joint on the right demonstrates grade 3 separation.  No fracture dislocation is present.  Shoulder is reduced.  No significant arthritis in the glenohumeral joint or AC joint.    PMFS History: Patient Active Problem List   Diagnosis Date Noted  . Elevated BP 11/01/2014  . Abnormal glucose 11/01/2014  . Medication management 11/01/2014  . Vitamin D deficiency 09/17/2013  . Hyperlipidemia   . DJD (degenerative joint disease)    Past Medical History:  Diagnosis Date  . Chronic knee pain   . DJD (degenerative joint disease)    knees  . Hyperlipidemia   . Vitamin D deficiency     History reviewed. No pertinent family history.  Past Surgical History:  Procedure Laterality Date  . KNEE ARTHROSCOPY Bilateral 2005 right 2007 left   Social History   Occupational History  . Not on file  Tobacco Use  . Smoking status: Never Smoker  .  Smokeless tobacco: Never Used  Substance and Sexual Activity  . Alcohol use: Yes    Comment: ocassional  . Drug use: No  . Sexual activity: Yes
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
